# Patient Record
Sex: Female | Born: 1995 | Race: White | Hispanic: No | Marital: Single | Smoking: Never smoker
Health system: Southern US, Community
[De-identification: ages and names within clinical notes are randomized; demographics above are authoritative.]

## PROBLEM LIST (undated history)

## (undated) DIAGNOSIS — K219 Gastro-esophageal reflux disease without esophagitis: Secondary | ICD-10-CM

## (undated) DIAGNOSIS — J45909 Unspecified asthma, uncomplicated: Secondary | ICD-10-CM

## (undated) HISTORY — PX: HIP PINNING: SHX1757

---
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
## 2018-05-23 ENCOUNTER — Ambulatory Visit (INDEPENDENT_AMBULATORY_CARE_PROVIDER_SITE_OTHER): Payer: Self-pay

## 2018-05-23 ENCOUNTER — Encounter (INDEPENDENT_AMBULATORY_CARE_PROVIDER_SITE_OTHER): Payer: Self-pay | Admitting: Orthopaedic Surgery

## 2018-05-23 ENCOUNTER — Ambulatory Visit (INDEPENDENT_AMBULATORY_CARE_PROVIDER_SITE_OTHER): Payer: BLUE CROSS/BLUE SHIELD | Admitting: Orthopaedic Surgery

## 2018-05-23 VITALS — BP 112/73 | HR 75 | Ht 66.0 in | Wt 130.0 lb

## 2018-05-23 DIAGNOSIS — M25551 Pain in right hip: Secondary | ICD-10-CM

## 2018-05-23 DIAGNOSIS — M545 Low back pain: Principal | ICD-10-CM

## 2018-05-23 DIAGNOSIS — G8929 Other chronic pain: Secondary | ICD-10-CM

## 2018-05-23 NOTE — Progress Notes (Signed)
Office Visit Note   Patient: Mackenzie Hinton           Date of Birth: 06/29/1996           MRN: [id number] Visit Date: 05/23/2018              Requested by: Joaquin CourtsFavero, John Patrick, DO [address] [place], [place] [id number] PCP: Joaquin CourtsFavero, John Patrick, DO   Assessment & Plan: Visit Diagnoses:  1. Chronic midline low back pain, with sciatica presence unspecified   2. Pain in right hip     Plan: Right hip pain with several diagnostic possibilities.  Kamrin could have a cam lesion with impingement.  There could be a labral tear.  I think it is worth obtaining an MR arthrogram.  Long discussion regarding diagnostic possibilities.  We will schedule the above study and have her return afterwards  Follow-Up Instructions: Return after MRI arthrogram right hip.   Orders:  Orders Placed This Encounter  Procedures  . XR Lumbar Spine 2-3 Views  . XR HIP UNILAT W OR W/O PELVIS 2-3 VIEWS RIGHT   No orders of the defined types were placed in this encounter.     Procedures: No procedures performed   Clinical Data: No additional findings.   Subjective: Chief Complaint  Patient presents with  . Follow-up    R HIP PAIN FOR 2 WKS NO INJURY, POPS ALL THE TIME BUT GETTING WORSE HAD SURGERY 2011  Mackenzie Hinton is presently 22 years old.  At age 22 she was diagnosed with a slipped capital femoral epiphysis and underwent in situ pinning.  There was little if any slippage.  Pins were subsequently removed.  She is also had similar procedure on the left side.  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  She has been experiencing some right groin/hip pain particularly when she is "working out".  She is had some popping and clicking.  Occasionally at night she will have some discomfort.  She is also been experiencing some back pain without any referred discomfort to either lower extremity.  She has not had any numbness or tingling.  No obvious injury or trauma.  She is not  having any pain on the left. Mackenzie Hinton has noted some discomfort off-and-on in her right hip since she had surgery but over the last several months it just seems to be a little bit more prominent thus prompting her office visit.  HPI  Review of Systems  Constitutional: Negative for fatigue and fever.  HENT: Negative for ear pain.   Eyes: Negative for pain.  Respiratory: Negative for cough and shortness of breath.   Cardiovascular: Positive for leg swelling.  Gastrointestinal: Negative for constipation and diarrhea.  Genitourinary: Negative for difficulty urinating.  Musculoskeletal: Positive for back pain and neck pain.  Skin: Negative for rash.  Allergic/Immunologic: Positive for food allergies.  Neurological: Positive for weakness. Negative for numbness.  Hematological: Does not bruise/bleed easily.  Psychiatric/Behavioral: Positive for sleep disturbance.     Objective: Vital Signs: BP 112/73 (BP Location: Left Arm, Patient Position: Sitting, Cuff Size: Normal)   Pulse 75   Ht 5\' 6"  (1.676 m)   Wt 130 lb (59 kg)   BMI 20.98 kg/m   Physical Exam  Constitutional: She is oriented to person, place, and time. She appears well-developed and well-nourished.  HENT:  Mouth/Throat: Oropharynx is clear and moist.  Eyes: Pupils are equal, round, and reactive to light. EOM are normal.  Pulmonary/Chest: Effort normal.  Neurological: She is alert and oriented to person, place, and time.  Skin: Skin is warm and dry.  Psychiatric: She has a normal mood and affect. Her behavior is normal.    Ortho Exam awake alert and oriented x3.  Comfortable sitting.  Does not limp.  Painless range of motion of both hips with internal and external rotation.  Range of motion was symmetrical.  She does, however, experience some right groin pain with hyperflexion of her hip with internal and external rotation.  Straight leg raise negative.  No areas of local tenderness about her right hip or her lumbar spine.   No percussible tenderness at the lumbosacral junction.  No pain over the sacroiliac joints with a figure 4 testing.  No distal edema.  Neurovascular exam  Specialty Comments:  No specialty comments available.  Imaging: Xr Hip Unilat W Or W/o Pelvis 2-3 Views Right  Result Date: 05/23/2018 AP the pelvis and lateral of the right hip were obtained.  Patient has had prior cannulated pins inserted for a slipped capital femoral epiphysis 11 years ago.  No obvious degenerative changes about either right or left hip.  No ectopic calcification.  No evidence of avascular necrosis.  There is some prominence of the inferior and superior femoral head that could be consistent with a cam lesion.  Xr Lumbar Spine 2-3 Views  Result Date: 05/23/2018 Films of the lumbar spine obtained in 2 projections.  There is no evidence of spondylolisthesis or scoliosis.  Transverse processes are intact.  Sacroiliac joints intact.  Spaces are well-maintained.  Facet joints appear to be intact.  No acute changes    PMFS History: There are no active problems to display for this patient.  Past Medical History:  Diagnosis Date  . Asthma    exercise induced   . GERD (gastroesophageal reflux disease)     History reviewed. No pertinent family history.  Past Surgical History:  Procedure Laterality Date  . HIP PINNING    . HYMENECTOMY N/A 03/19/2014   Procedure: HYMENECTOMY;  Surgeon: Alphonsus Sias. Ernestina Penna, MD;  Location: [facility];  Service: Gynecology;  Laterality: N/A;   Social History   Occupational History  . Not on file  Tobacco Use  . Smoking status: Never Smoker  . Smokeless tobacco: Never Used  Substance and Sexual Activity  . Alcohol use: Yes    Comment: [facility]CC [employer] WINE  . Drug use: Not Currently  . Sexual activity: Not on file

## 2018-06-13 ENCOUNTER — Ambulatory Visit (INDEPENDENT_AMBULATORY_CARE_PROVIDER_SITE_OTHER): Payer: BLUE CROSS/BLUE SHIELD | Admitting: Orthopaedic Surgery

## 2018-06-23 ENCOUNTER — Encounter (INDEPENDENT_AMBULATORY_CARE_PROVIDER_SITE_OTHER): Payer: Self-pay | Admitting: *Deleted

## 2019-10-01 ENCOUNTER — Encounter: Payer: Self-pay | Admitting: Physician Assistant

## 2019-10-12 ENCOUNTER — Ambulatory Visit: Payer: Self-pay | Admitting: Physician Assistant

## 2024-08-07 ENCOUNTER — Other Ambulatory Visit (HOSPITAL_COMMUNITY): Payer: Self-pay | Admitting: Physician Assistant

## 2024-08-07 DIAGNOSIS — R002 Palpitations: Secondary | ICD-10-CM

## 2024-08-10 ENCOUNTER — Ambulatory Visit (HOSPITAL_COMMUNITY)
Admission: RE | Admit: 2024-08-10 | Discharge: 2024-08-10 | Disposition: A | Discharge status: Home / Self Care | Payer: PRIVATE HEALTH INSURANCE | Source: Ambulatory Visit | Attending: Surgery | Admitting: Surgery

## 2024-08-10 DIAGNOSIS — R55 Syncope and collapse: Secondary | ICD-10-CM

## 2024-08-10 DIAGNOSIS — R002 Palpitations: Secondary | ICD-10-CM | POA: Insufficient documentation

## 2024-08-10 LAB — ECHOCARDIOGRAM COMPLETE
AR max vel: 2.01 cm2
AV Area VTI: 2.09 cm2
AV Area mean vel: 1.89 cm2
AV Mean grad: 2 mmHg
AV Peak grad: 4.4 mmHg
Ao pk vel: 1.05 m/s
Area-P 1/2: 4.39 cm2
S' Lateral: 2.39 cm

## 2024-08-23 DIAGNOSIS — R002 Palpitations: Secondary | ICD-10-CM | POA: Insufficient documentation

## 2024-08-23 NOTE — Progress Notes (Unsigned)
 Cardiology Office Note   Date:  08/26/2024   ID:  Mackenzie Hinton, DOB 1996-05-27, MRN [id number]  PCP:  Levander Norleen Dover, DO  Cardiologist:   Lynwood Schilling, MD Referring:  Ursula Fallow, [place]  Chief Complaint  Patient presents with   Palpitations      History of Present Illness: Mackenzie Hinton is a 28 y.o. female who presents for evaluation of palpitations. She is referred by Mackenzie, John Patrick, DO.  She has a history of rapid heartbeat.  She has not quantified this over time but she knows that she feels a go fast.  It would also go down.  She will feel anxious at times when it is rapid.  She will feel fatigued.  She at times has had to stop and take a nap even with driving.  This happens occasionally and she has not actually passed out.  She does describe some orthostatic symptoms or things get gray but she sits down and has avoided any fainting.  She has had chronically low blood pressure with systolics in 85 or so.  Her mother has a history of bicuspid aortic valve. The patient had an echo in Sept demonstrated an EF of 55 - 60%.   There no valvular abnormalities.    She was active as a child and did sports but she did have difficulties with recovery.  She has alpha gal.  She has other food allergies and avoids certain food groups.  It was suggested that she wear a monitor but she has an adhesive allergy.   Past Medical History:  Diagnosis Date   Asthma    exercise induced    GERD (gastroesophageal reflux disease)     Past Surgical History:  Procedure Laterality Date   HIP PINNING     HYMENECTOMY N/A 03/19/2014   Procedure: HYMENECTOMY;  Surgeon: Burnard LABOR. [name], MD;  Location: [facility];  Service: Gynecology;  Laterality: N/A;     Current Outpatient Medications  Medication Sig Dispense Refill   albuterol (VENTOLIN HFA) 108 (90 Base) MCG/ACT inhaler Ventolin HFA 90 mcg/actuation aerosol inhaler  1 INHALATION AS NEEDED     diphenhydrAMINE (BENADRYL ALLERGY) 25  MG tablet Benadryl Allergy 25 mg tablet  Take 2 tablets every 4 hours by oral route. (Patient taking differently: 25 mg every 4 (four) hours as needed.)     ibuprofen  (MOTRIN  IB) 600 MG tablet Take 1 tablet (600 mg total) by mouth every 6 (six) hours as needed. 60 tablet 1   EPINEPHrine 0.3 mg/0.3 mL IJ SOAJ injection epinephrine 0.3 mg/0.3 mL injection, auto-injector     No current facility-administered medications for this visit.    Allergies:   Amoxicillin, Cephalexin, Beef (bovine) protein, Latex, Milk protein, Pork allergy, and Tape    Social History:  The patient  reports that she has never smoked. She has never used smokeless tobacco. She reports current alcohol use. She reports that she does not currently use drugs.   Family History:  The patient's family history is positive for her mother having bicuspid aortic valve and left bundle branch block and being a patient here.  There is otherwise tributary cardiac or vascular history.   ROS:  Please see the history of present illness.   Otherwise, review of systems are positive for none.   All other systems are reviewed and negative.    PHYSICAL EXAM: VS:  BP 110/78   Pulse 79   Ht 5' 6 (1.676 m)   Wt 135 lb (61.2  kg)   BMI 21.79 kg/m  , BMI Body mass index is 21.79 kg/m. GENERAL:  Well appearing HEENT:  Pupils equal round and reactive, fundi not visualized, oral mucosa unremarkable NECK:  No jugular venous distention, waveform within normal limits, carotid upstroke brisk and symmetric, no bruits, no thyromegaly LYMPHATICS:  No cervical, inguinal adenopathy LUNGS:  Clear to auscultation bilaterally BACK:  No CVA tenderness CHEST:  Unremarkable HEART:  PMI not displaced or sustained,S1 and S2 within normal limits, no S3, no S4, no clicks, no rubs, no murmurs ABD:  Flat, positive bowel sounds normal in frequency in pitch, no bruits, no rebound, no guarding, no midline pulsatile mass, no hepatomegaly, no splenomegaly EXT:  2 plus  pulses throughout, no edema, no cyanosis no clubbing SKIN:  No rashes no nodules NEURO:  Cranial nerves II through XII grossly intact, motor grossly intact throughout PSYCH:  Cognitively intact, oriented to person place and time    EKG:  EKG Interpretation Date/Time:  Wednesday August 26 2024 15:08:48 EDT Ventricular Rate:  79 PR Interval:  152 QRS Duration:  86 QT Interval:  366 QTC Calculation: 419 R Axis:   72  Text Interpretation: Normal sinus rhythm with sinus arrhythmia Normal ECG No previous ECGs available Confirmed by Lavona Agent ([id number]) on 08/26/2024 3:23:41 PM     Recent Labs: No results found for requested labs within last 365 days.    Lipid Panel No results found for: CHOL, TRIG, HDL, CHOLHDL, VLDL, LDLCALC, LDLDIRECT    Wt Readings from Last 3 Encounters:  08/26/24 135 lb (61.2 kg)  05/23/18 130 lb (59 kg)  03/19/14 120 lb (54.4 kg) (41%, Z= -0.22)*   * Growth percentiles are based on CDC (Girls, 2-20 Years) data.      Other studies Reviewed: Additional studies/ records that were reviewed today include: Labs. Review of the above records demonstrates:  Please see elsewhere in the note.     ASSESSMENT AND PLAN:   Palpitations: Today in the office she was not orthostatic.  We had a very long discussion about this autonomic symptoms and now she does not meet diagnostic criteria for POTS her symptoms are consistent with autonomic dysfunction.  At this point I would not suggest medications and she would like to avoid these.  We talked about compression garments.  She is starting to hydrate with electrolytes.  We spent a long time talking about recumbent exercise.  I am going to send her a 4-week hypoallergenic monitor to make sure she does not have some primary dysrhythmia.  Pending these results we will see how she does with the more conservative therapies.  We talked about exit strategies to avoid syncope and we talked about avoiding  triggers.  Current medicines are reviewed at length with the patient today.  The patient does not have concerns regarding medicines.  The following changes have been made:  no change  Labs/ tests ordered today include:   Orders Placed This Encounter  Procedures   CARDIAC EVENT MONITOR   EKG 12-Lead     Disposition:   FU with with me as needed.     Signed, Agent Lavona, MD  08/26/2024 5:06 PM    [facility]

## 2024-08-26 ENCOUNTER — Encounter: Payer: Self-pay | Admitting: Cardiology

## 2024-08-26 ENCOUNTER — Ambulatory Visit: Payer: PRIVATE HEALTH INSURANCE | Admitting: Cardiology

## 2024-08-26 VITALS — BP 110/78 | HR 79 | Ht 66.0 in | Wt 135.0 lb

## 2024-08-26 DIAGNOSIS — R002 Palpitations: Secondary | ICD-10-CM | POA: Diagnosis not present

## 2024-08-26 NOTE — Patient Instructions (Addendum)
 Medication Instructions:  Your physician recommends that you continue on your current medications as directed. Please refer to the Current Medication list given to you today.  Labwork: none  Testing/Procedures: Your physician has recommended that you wear an event monitor for 30 days. Event monitors are medical devices that record the heart's electrical activity. Doctors most often us  these monitors to diagnose arrhythmias. Arrhythmias are problems with the speed or rhythm of the heartbeat. The monitor is a small, portable device. You can wear one while you do your normal daily activities. This is usually used to diagnose what is causing palpitations/syncope (passing out). [employer] or [employer] will send this to your home address.  Follow-Up: Your physician recommends that you schedule a follow-up appointment in: as needed.  Any Other Special Instructions Will Be Listed Below (If Applicable).  If you need a refill on your cardiac medications before your next appointment, please call your pharmacy.
